# Patient Record
Sex: Female | Born: 1945 | Race: White | Hispanic: No | Marital: Married | Smoking: Never smoker
Health system: Southern US, Community
[De-identification: ages and names within clinical notes are randomized; demographics above are authoritative.]

## PROBLEM LIST (undated history)

## (undated) DIAGNOSIS — C801 Malignant (primary) neoplasm, unspecified: Secondary | ICD-10-CM

## (undated) HISTORY — PX: BREAST SURGERY: SHX581

## (undated) HISTORY — DX: Malignant (primary) neoplasm, unspecified: C80.1

## (undated) HISTORY — PX: APPENDECTOMY: SHX54

---

## 2007-07-19 ENCOUNTER — Encounter: Admission: RE | Admit: 2007-07-19 | Discharge: 2007-07-19 | Payer: Self-pay | Admitting: Obstetrics & Gynecology

## 2009-08-24 HISTORY — PX: BREAST LUMPECTOMY: SHX2

## 2009-09-15 ENCOUNTER — Encounter: Admission: RE | Admit: 2009-09-15 | Discharge: 2009-09-15 | Payer: Self-pay | Admitting: Obstetrics & Gynecology

## 2009-09-16 ENCOUNTER — Telehealth (INDEPENDENT_AMBULATORY_CARE_PROVIDER_SITE_OTHER): Payer: Self-pay | Admitting: *Deleted

## 2009-09-18 ENCOUNTER — Encounter: Payer: Self-pay | Admitting: Family Medicine

## 2009-09-23 ENCOUNTER — Encounter: Admission: RE | Admit: 2009-09-23 | Discharge: 2009-09-23 | Payer: Self-pay | Admitting: Obstetrics & Gynecology

## 2009-09-25 ENCOUNTER — Ambulatory Visit: Payer: Self-pay | Admitting: Genetic Counselor

## 2009-10-09 ENCOUNTER — Encounter: Admission: RE | Admit: 2009-10-09 | Discharge: 2009-10-09 | Payer: Self-pay | Admitting: Surgery

## 2009-10-09 ENCOUNTER — Ambulatory Visit (HOSPITAL_BASED_OUTPATIENT_CLINIC_OR_DEPARTMENT_OTHER): Admission: RE | Admit: 2009-10-09 | Discharge: 2009-10-09 | Payer: Self-pay | Admitting: Surgery

## 2009-10-23 ENCOUNTER — Ambulatory Visit: Payer: Self-pay | Admitting: Oncology

## 2010-05-17 ENCOUNTER — Encounter: Payer: Self-pay | Admitting: Obstetrics & Gynecology

## 2010-05-26 NOTE — Progress Notes (Signed)
  Phone Note Call from Patient Call back at Home Phone [phone]   Caller: Charlaine Dalton Call For: Dr.Tower Summary of Call: Pt's husband called to cancel pt's new pt. appt. on 5/26/11with you.  Pt. was seen today for a small lump in her breast and they think it's Cancer and they scheduled her an appt. on Thursday morning to discuss how they'll proceed.  Pt's husband said they'll call back to r/s her appt.. Initial call taken by: Beau Fanny,  Sep 16, 2009 4:18 PM  Follow-up for Phone Call        thanks for the update -- please give them my best wishes  Follow-up by: Judith Part MD,  Sep 16, 2009 5:05 PM

## 2010-05-26 NOTE — Letter (Signed)
Summary: [facility]   Imported By: Lanelle Bal 10/09/2009 13:24:52  _____________________________________________________________________  External Attachment:    Type:   Image     Comment:   External Document

## 2010-07-13 LAB — CBC
HCT: 41.2 % (ref 36.0–46.0)
Hemoglobin: 14.1 g/dL (ref 12.0–15.0)
MCHC: 34.3 g/dL (ref 30.0–36.0)
Platelets: ADEQUATE 10*3/uL (ref 150–400)

## 2010-07-13 LAB — COMPREHENSIVE METABOLIC PANEL
ALT: 27 U/L (ref 0–35)
AST: 27 U/L (ref 0–37)
Alkaline Phosphatase: 80 U/L (ref 39–117)
BUN: 10 mg/dL (ref 6–23)
Creatinine, Ser: 0.8 mg/dL (ref 0.4–1.2)
GFR calc non Af Amer: >60 mL/min (ref >60)
Glucose, Bld: 116 mg/dL — ABNORMAL HIGH (ref 70–99)
Sodium: 137 mEq/L (ref 135–145)
Total Bilirubin: 0.7 mg/dL (ref 0.3–1.2)

## 2010-07-13 LAB — DIFFERENTIAL
Basophils Relative: 0 % (ref 0–1)
Eosinophils Absolute: 0.3 10*3/uL (ref 0.0–0.7)
Lymphs Abs: 2.5 10*3/uL (ref 0.7–4.0)
Neutro Abs: 5.5 10*3/uL (ref 1.7–7.7)
Neutrophils Relative %: 62 % (ref 43–77)

## 2010-07-13 LAB — LACTATE DEHYDROGENASE: LDH: 194 U/L (ref 94–250)

## 2011-04-06 ENCOUNTER — Other Ambulatory Visit: Payer: Self-pay | Admitting: Obstetrics & Gynecology

## 2011-08-08 IMAGING — MG MM DIAGNOSTIC UNILATERAL R
2 series · 2 of 2 positions shown · non-contrast
Comparison: With priors

CLINICAL DATA: Status post ultrasound-guided core biopsy of the
right breast

DIGITAL DIAGNOSTIC RIGHT MAMMOGRAM

[R CC]
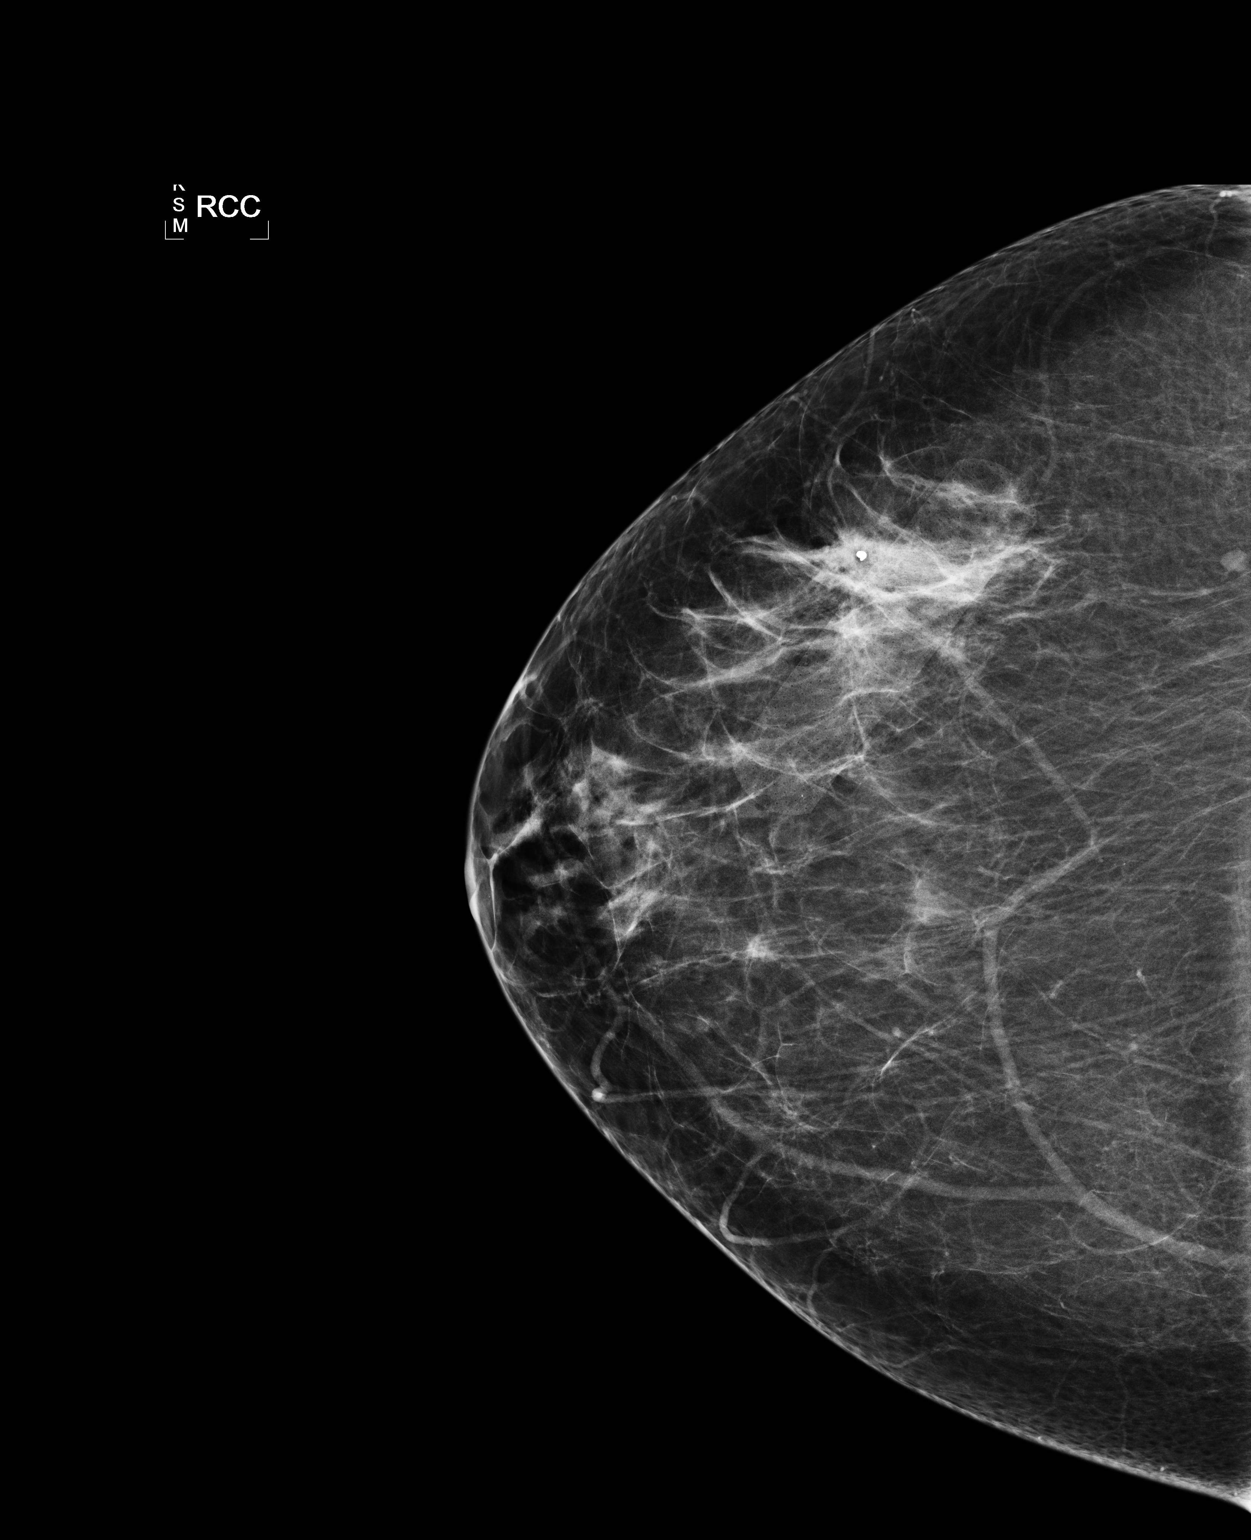

[R ML]
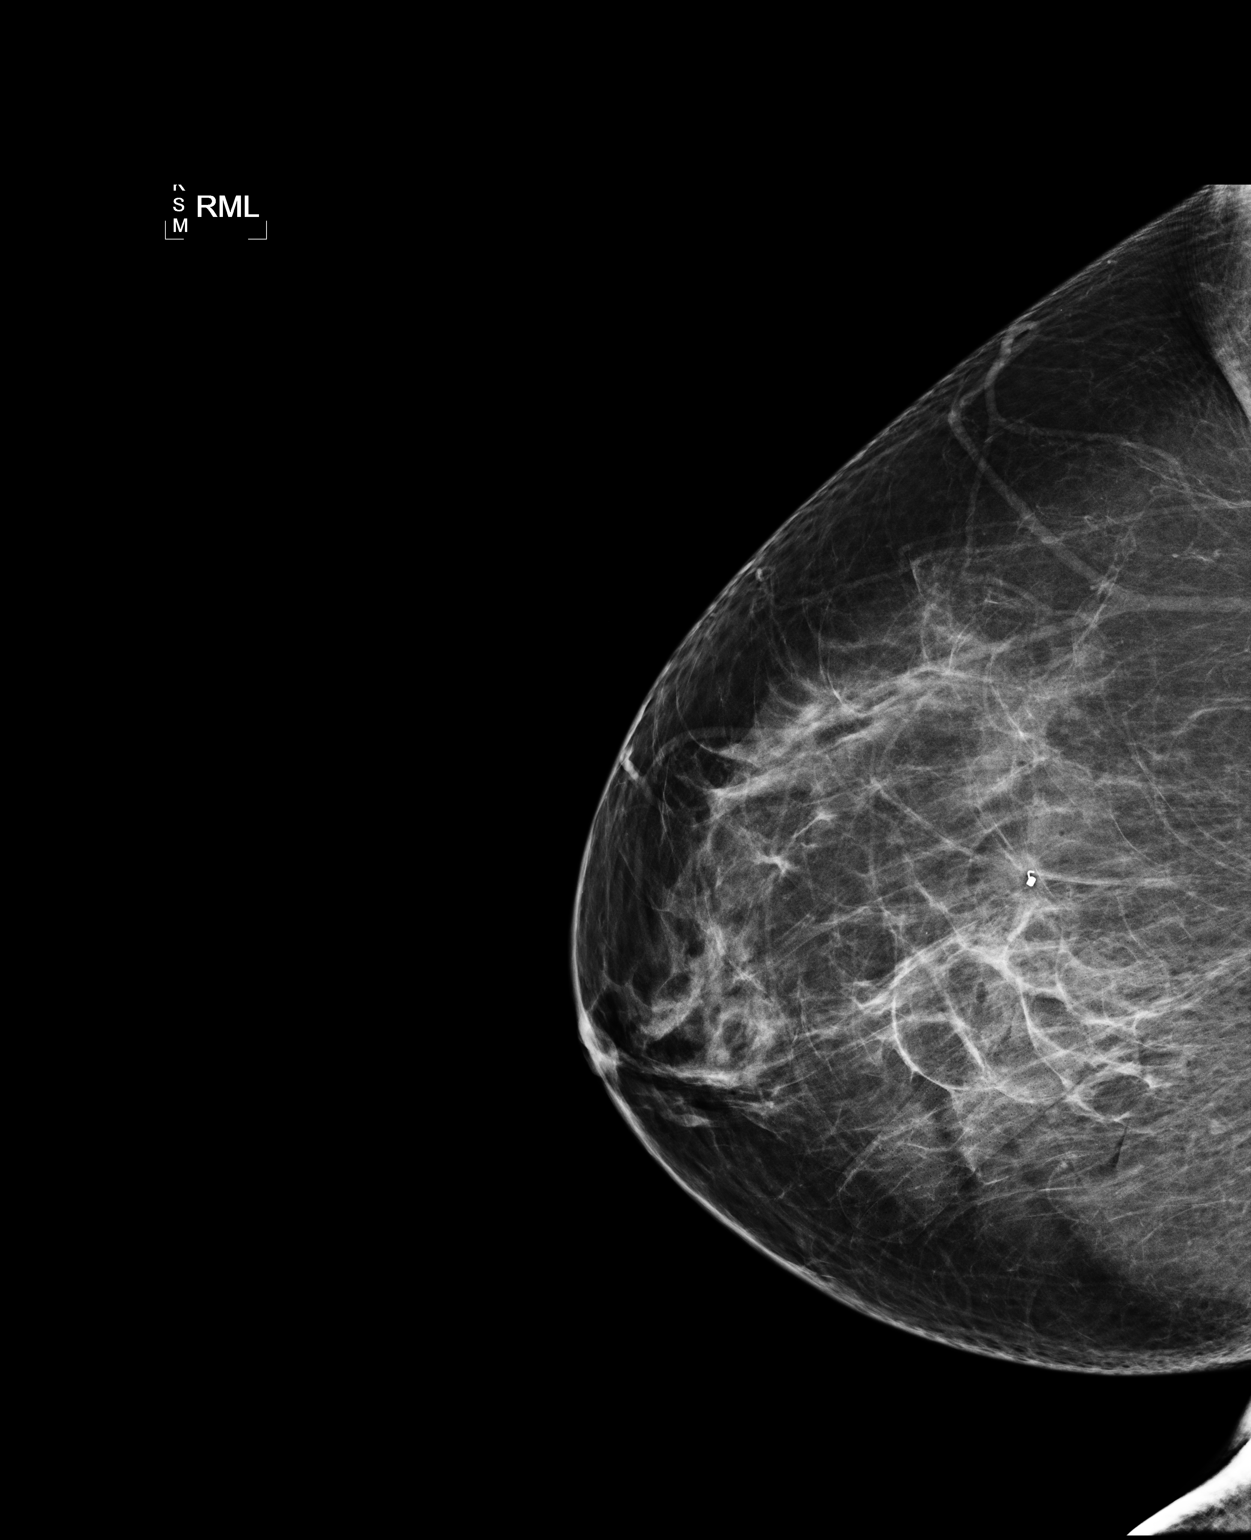

[2 of 2 positions shown; findings below may reference images not displayed]

FINDINGS: Films are performed following ultrasound guided biopsy
of a mass in the upper outer quadrant of the right breast.
Mammographic images demonstrate the clip is in the mass in the
upper outer quadrant of the right breast.
IMPRESSION: Status post ultrasound-guided core biopsy of the right breast with
pathology pending.

## 2011-10-22 ENCOUNTER — Other Ambulatory Visit: Payer: Self-pay | Admitting: Obstetrics & Gynecology

## 2011-10-22 DIAGNOSIS — Z853 Personal history of malignant neoplasm of breast: Secondary | ICD-10-CM

## 2011-10-22 DIAGNOSIS — Z9889 Other specified postprocedural states: Secondary | ICD-10-CM

## 2011-11-03 ENCOUNTER — Ambulatory Visit
Admission: RE | Admit: 2011-11-03 | Discharge: 2011-11-03 | Disposition: A | Discharge status: Home / Self Care | Payer: Self-pay | Source: Ambulatory Visit | Attending: Obstetrics & Gynecology | Admitting: Obstetrics & Gynecology

## 2011-11-03 DIAGNOSIS — Z853 Personal history of malignant neoplasm of breast: Secondary | ICD-10-CM

## 2011-11-03 DIAGNOSIS — Z9889 Other specified postprocedural states: Secondary | ICD-10-CM

## 2013-09-03 ENCOUNTER — Encounter: Payer: Self-pay | Admitting: Podiatry

## 2013-09-03 ENCOUNTER — Ambulatory Visit (INDEPENDENT_AMBULATORY_CARE_PROVIDER_SITE_OTHER): Payer: Medicare HMO

## 2013-09-03 ENCOUNTER — Ambulatory Visit (INDEPENDENT_AMBULATORY_CARE_PROVIDER_SITE_OTHER): Payer: Medicare HMO | Admitting: Podiatry

## 2013-09-03 VITALS — BP 129/69 | HR 54 | Resp 16 | Ht 62.0 in | Wt 173.0 lb

## 2013-09-03 DIAGNOSIS — M79609 Pain in unspecified limb: Secondary | ICD-10-CM

## 2013-09-03 DIAGNOSIS — M79673 Pain in unspecified foot: Secondary | ICD-10-CM

## 2013-09-03 DIAGNOSIS — M722 Plantar fascial fibromatosis: Secondary | ICD-10-CM

## 2013-09-03 MED ORDER — MELOXICAM 15 MG PO TABS: 15.0000 mg | ORAL_TABLET | Freq: Every day | ORAL | Status: DC

## 2013-09-03 MED ORDER — METHYLPREDNISOLONE (PAK) 4 MG PO TABS
ORAL_TABLET | ORAL | Status: DC
Start: 2013-09-03 — End: 2014-11-27

## 2013-09-03 NOTE — Progress Notes (Signed)
   Subjective:    Patient ID: Katie Osborne, female    DOB: 26-Dec-1945, 68 y.o.   MRN: [id number]  HPI Comments: My left foot in the arch and heel hurts. Its been going on for 3 weeks. The pain seems to be worse on different days. i use dr schoals pads in my shoes and i do stretch my foot.  Foot Pain      Review of Systems  HENT:       Ringing in ears  All other systems reviewed and are negative.      Objective:   Physical Exam: I have reviewed her past medical history medications allergies surgeries social history and review of systems. Pulses are strongly palpable bilateral foot. Capillary fill time to digits one through 5 bilateral is immediate. Neurologic sensorium is intact per Semmes-Weinstein monofilament 1 through 5 bilateral. Deep tendon reflexes are brisk and equal bilateral. Muscle strength is 5 over 5 dorsiflexors plantar flexors inverters everters all intrinsic musculature is intact. Orthopedic evaluation demonstrates pain on palpation medial continued tubercle of the left heel. Radiographic evaluation confirms plantar distally oriented calcaneal spur with soft tissue increase in density at the plantar fascial calcaneal insertion site indicative of plantar fasciitis left heel.        Assessment & Plan:  Assessment: Plantar fasciitis left.  Plan: Discussed etiology pathology conservative versus surgical therapies. At this point we injected the left heel today. She was dispensed a plantar fascial brace as well as a night splint. Discussed the use of prescription medication including Medrol Dosepak to be followed by Monday. We also discussed shoe gear therapy stretching exercises and shoe gear medications as well as ice therapy I will followup with her in one month

## 2013-10-03 ENCOUNTER — Encounter: Payer: Self-pay | Admitting: Podiatry

## 2013-10-03 ENCOUNTER — Ambulatory Visit (INDEPENDENT_AMBULATORY_CARE_PROVIDER_SITE_OTHER): Payer: Medicare HMO | Admitting: Podiatry

## 2013-10-03 VITALS — BP 120/80 | HR 64 | Resp 16

## 2013-10-03 DIAGNOSIS — M722 Plantar fascial fibromatosis: Secondary | ICD-10-CM

## 2013-10-03 NOTE — Progress Notes (Signed)
She presents today states that her plantar fasciitis to her left foot is proximally 75% better now. She only has a little tenderness right ear a she points to the insertion site of the posterior tibial tendon left foot.  Objective: Pulses are strongly palpable left foot. Mild tenderness on palpation of the insertion site of the posterior tibial tendon at the navicular tuberosity. She has no pain on palpation of the medial calcaneal tubercle at the plantar fascial insertion site. She has no pain on palpation of the calf negative Homans sign.  Assessment: Resolving plantar fasciitis and 75% left foot. Mild compensatory posterior tibial tendinitis left. Compensatory pain for her left leg as well.  Plan: Discussed etiology pathology conservative versus surgical therapies at this point I encouraged her to continue all conservative therapies and tissues 100% well plus one month. She will notify should she have a recurrence.

## 2013-12-25 ENCOUNTER — Other Ambulatory Visit: Payer: Self-pay | Admitting: Obstetrics & Gynecology

## 2014-01-01 ENCOUNTER — Other Ambulatory Visit: Payer: Self-pay | Admitting: Obstetrics & Gynecology

## 2014-01-01 DIAGNOSIS — Z853 Personal history of malignant neoplasm of breast: Secondary | ICD-10-CM

## 2014-01-09 ENCOUNTER — Encounter (INDEPENDENT_AMBULATORY_CARE_PROVIDER_SITE_OTHER): Payer: Self-pay

## 2014-01-09 ENCOUNTER — Ambulatory Visit
Admission: RE | Admit: 2014-01-09 | Discharge: 2014-01-09 | Disposition: A | Discharge status: Home / Self Care | Payer: Medicare HMO | Source: Ambulatory Visit | Attending: Obstetrics & Gynecology | Admitting: Obstetrics & Gynecology

## 2014-01-09 DIAGNOSIS — Z853 Personal history of malignant neoplasm of breast: Secondary | ICD-10-CM

## 2014-11-12 ENCOUNTER — Other Ambulatory Visit: Payer: Self-pay

## 2014-11-13 ENCOUNTER — Other Ambulatory Visit: Payer: Self-pay | Admitting: Obstetrics & Gynecology

## 2014-11-13 DIAGNOSIS — Z9889 Other specified postprocedural states: Secondary | ICD-10-CM

## 2014-11-13 DIAGNOSIS — C50911 Malignant neoplasm of unspecified site of right female breast: Secondary | ICD-10-CM

## 2014-11-27 ENCOUNTER — Ambulatory Visit: Payer: Self-pay

## 2014-11-27 ENCOUNTER — Ambulatory Visit (INDEPENDENT_AMBULATORY_CARE_PROVIDER_SITE_OTHER): Payer: Medicare HMO | Admitting: Podiatry

## 2014-11-27 ENCOUNTER — Encounter: Payer: Self-pay | Admitting: Podiatry

## 2014-11-27 VITALS — BP 146/74 | HR 73 | Resp 16

## 2014-11-27 DIAGNOSIS — Q828 Other specified congenital malformations of skin: Secondary | ICD-10-CM | POA: Diagnosis not present

## 2014-11-27 NOTE — Progress Notes (Signed)
She presents today for follow-up of a porokeratosis plantar aspect of the right foot. She states that it has finally come back and it hurts terribly.  Objective: Vital signs are stable she is alert and oriented 3 pulses are palpable bilateral. Porokeratosis or visible beneath the second metatarsophalangeal joints bilaterally. Right greater than left.  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  Plan: Debridement of porokeratosis today mechanically bilaterally I also did place a salicylic acid patch to be left on for 3 days without getting wet and then to be removed. I will follow-up with her in 6 weeks if necessary.

## 2014-12-23 ENCOUNTER — Ambulatory Visit
Admission: RE | Admit: 2014-12-23 | Discharge: 2014-12-23 | Disposition: A | Discharge status: Home / Self Care | Payer: Medicare HMO | Source: Ambulatory Visit | Attending: Obstetrics & Gynecology | Admitting: Obstetrics & Gynecology

## 2014-12-23 DIAGNOSIS — Z9889 Other specified postprocedural states: Secondary | ICD-10-CM

## 2014-12-23 DIAGNOSIS — C50911 Malignant neoplasm of unspecified site of right female breast: Secondary | ICD-10-CM

## 2015-01-08 ENCOUNTER — Ambulatory Visit: Payer: Medicare HMO | Admitting: Podiatry

## 2015-05-29 DIAGNOSIS — H60543 Acute eczematoid otitis externa, bilateral: Secondary | ICD-10-CM | POA: Diagnosis not present

## 2015-05-29 DIAGNOSIS — H903 Sensorineural hearing loss, bilateral: Secondary | ICD-10-CM | POA: Diagnosis not present

## 2015-05-29 DIAGNOSIS — H9319 Tinnitus, unspecified ear: Secondary | ICD-10-CM | POA: Diagnosis not present

## 2015-05-29 DIAGNOSIS — H6123 Impacted cerumen, bilateral: Secondary | ICD-10-CM | POA: Diagnosis not present

## 2015-07-21 ENCOUNTER — Telehealth: Payer: Self-pay | Admitting: Podiatry

## 2015-07-21 ENCOUNTER — Telehealth: Payer: Self-pay | Admitting: *Deleted

## 2015-07-21 NOTE — Telephone Encounter (Signed)
PT REQUEST SOME KIND OF MEDICINE FOR HER PAIN UNTIL HER APPT ON 07/28/15

## 2015-07-21 NOTE — Telephone Encounter (Signed)
Pt states she is having pain that began burning and tingling in the plantar fascia again and began to use the ice, ibuprofen and walking boot again.  Pt states the pain runs from the bottom of her foot up the back of her leg to her knee. Pt states she's been using heat to the calf.  Pt denies hardness, redness or edema to the calf, but states her calves are swollen most of the time anyway.  I told pt to continue her therapies and I would call again if more instructions before Thursday.

## 2015-07-24 ENCOUNTER — Encounter: Payer: Self-pay | Admitting: Podiatry

## 2015-07-24 ENCOUNTER — Ambulatory Visit (INDEPENDENT_AMBULATORY_CARE_PROVIDER_SITE_OTHER): Payer: Medicare HMO | Admitting: Podiatry

## 2015-07-24 VITALS — BP 154/86 | HR 60 | Resp 12

## 2015-07-24 DIAGNOSIS — Q828 Other specified congenital malformations of skin: Secondary | ICD-10-CM | POA: Diagnosis not present

## 2015-07-24 DIAGNOSIS — M722 Plantar fascial fibromatosis: Secondary | ICD-10-CM

## 2015-07-24 MED ORDER — MELOXICAM 15 MG PO TABS: 15.0000 mg | ORAL_TABLET | Freq: Every day | ORAL | Status: DC

## 2015-07-25 ENCOUNTER — Encounter: Payer: Self-pay | Admitting: Podiatry

## 2015-07-26 NOTE — Progress Notes (Signed)
She presents today with a chief complaint of a painful callus plantar aspect of the forefoot left. She is also complaining of left heel pain that has started to redevelop.  Objective: Vital signs are stable alert and oriented 3. Pulses are palpable. Neurologic sensorium is intact. She has pain on palpation medial calcaneal tubercle of the left heel. No calf pain. Reactive hyperkeratosis forefoot left. No edema no cellulitis drainage or odor.  Assessment: Porokeratosis callus forefoot left. Plantar fasciitis left.  Plan: We injected the left heel today she will start back on all conservative therapies. I debrided all reactive hyperkeratosis for her today follow up with her as needed.

## 2015-07-28 ENCOUNTER — Ambulatory Visit: Payer: Medicare HMO | Admitting: Podiatry

## 2015-11-20 ENCOUNTER — Other Ambulatory Visit: Payer: Self-pay | Admitting: Obstetrics & Gynecology

## 2015-11-20 DIAGNOSIS — Z853 Personal history of malignant neoplasm of breast: Secondary | ICD-10-CM

## 2015-12-17 DIAGNOSIS — Z124 Encounter for screening for malignant neoplasm of cervix: Secondary | ICD-10-CM | POA: Diagnosis not present

## 2015-12-17 DIAGNOSIS — Z01419 Encounter for gynecological examination (general) (routine) without abnormal findings: Secondary | ICD-10-CM | POA: Diagnosis not present

## 2015-12-17 DIAGNOSIS — Z6833 Body mass index (BMI) 33.0-33.9, adult: Secondary | ICD-10-CM | POA: Diagnosis not present

## 2015-12-24 ENCOUNTER — Ambulatory Visit
Admission: RE | Admit: 2015-12-24 | Discharge: 2015-12-24 | Disposition: A | Discharge status: Home / Self Care | Payer: Medicare HMO | Source: Ambulatory Visit | Attending: Obstetrics & Gynecology | Admitting: Obstetrics & Gynecology

## 2015-12-24 DIAGNOSIS — R928 Other abnormal and inconclusive findings on diagnostic imaging of breast: Secondary | ICD-10-CM | POA: Diagnosis not present

## 2015-12-24 DIAGNOSIS — Z853 Personal history of malignant neoplasm of breast: Secondary | ICD-10-CM

## 2016-11-15 DIAGNOSIS — R69 Illness, unspecified: Secondary | ICD-10-CM | POA: Diagnosis not present

## 2016-11-16 ENCOUNTER — Other Ambulatory Visit: Payer: Self-pay | Admitting: Obstetrics & Gynecology

## 2016-11-16 DIAGNOSIS — Z1231 Encounter for screening mammogram for malignant neoplasm of breast: Secondary | ICD-10-CM

## 2016-12-20 DIAGNOSIS — Z6832 Body mass index (BMI) 32.0-32.9, adult: Secondary | ICD-10-CM | POA: Diagnosis not present

## 2016-12-20 DIAGNOSIS — Z124 Encounter for screening for malignant neoplasm of cervix: Secondary | ICD-10-CM | POA: Diagnosis not present

## 2016-12-28 ENCOUNTER — Ambulatory Visit
Admission: RE | Admit: 2016-12-28 | Discharge: 2016-12-28 | Disposition: A | Discharge status: Home / Self Care | Payer: Medicare HMO | Source: Ambulatory Visit | Attending: Obstetrics & Gynecology | Admitting: Obstetrics & Gynecology

## 2016-12-28 DIAGNOSIS — Z1231 Encounter for screening mammogram for malignant neoplasm of breast: Secondary | ICD-10-CM | POA: Diagnosis not present

## 2017-03-29 DIAGNOSIS — N390 Urinary tract infection, site not specified: Secondary | ICD-10-CM | POA: Diagnosis not present

## 2017-03-29 DIAGNOSIS — B373 Candidiasis of vulva and vagina: Secondary | ICD-10-CM | POA: Diagnosis not present

## 2017-03-29 DIAGNOSIS — R3913 Splitting of urinary stream: Secondary | ICD-10-CM | POA: Diagnosis not present

## 2017-05-25 DIAGNOSIS — R69 Illness, unspecified: Secondary | ICD-10-CM | POA: Diagnosis not present

## 2017-11-23 DIAGNOSIS — R69 Illness, unspecified: Secondary | ICD-10-CM | POA: Diagnosis not present

## 2017-11-24 ENCOUNTER — Other Ambulatory Visit: Payer: Self-pay | Admitting: Obstetrics & Gynecology

## 2017-11-24 DIAGNOSIS — Z1231 Encounter for screening mammogram for malignant neoplasm of breast: Secondary | ICD-10-CM

## 2017-12-27 DIAGNOSIS — Z6832 Body mass index (BMI) 32.0-32.9, adult: Secondary | ICD-10-CM | POA: Diagnosis not present

## 2017-12-27 DIAGNOSIS — Z01419 Encounter for gynecological examination (general) (routine) without abnormal findings: Secondary | ICD-10-CM | POA: Diagnosis not present

## 2017-12-30 ENCOUNTER — Ambulatory Visit
Admission: RE | Admit: 2017-12-30 | Discharge: 2017-12-30 | Disposition: A | Discharge status: Home / Self Care | Payer: Medicare HMO | Source: Ambulatory Visit | Attending: Obstetrics & Gynecology | Admitting: Obstetrics & Gynecology

## 2017-12-30 DIAGNOSIS — Z1231 Encounter for screening mammogram for malignant neoplasm of breast: Secondary | ICD-10-CM | POA: Diagnosis not present

## 2018-05-30 DIAGNOSIS — R69 Illness, unspecified: Secondary | ICD-10-CM | POA: Diagnosis not present

## 2018-06-06 DIAGNOSIS — E7800 Pure hypercholesterolemia, unspecified: Secondary | ICD-10-CM | POA: Insufficient documentation

## 2018-06-06 DIAGNOSIS — R918 Other nonspecific abnormal finding of lung field: Secondary | ICD-10-CM | POA: Diagnosis not present

## 2018-06-06 DIAGNOSIS — Z1211 Encounter for screening for malignant neoplasm of colon: Secondary | ICD-10-CM | POA: Diagnosis not present

## 2018-06-06 DIAGNOSIS — M25561 Pain in right knee: Secondary | ICD-10-CM | POA: Diagnosis not present

## 2018-06-06 DIAGNOSIS — M1711 Unilateral primary osteoarthritis, right knee: Secondary | ICD-10-CM | POA: Diagnosis not present

## 2018-06-06 DIAGNOSIS — Z79899 Other long term (current) drug therapy: Secondary | ICD-10-CM | POA: Diagnosis not present

## 2018-06-06 DIAGNOSIS — N8110 Cystocele, unspecified: Secondary | ICD-10-CM | POA: Diagnosis not present

## 2018-06-06 DIAGNOSIS — Z853 Personal history of malignant neoplasm of breast: Secondary | ICD-10-CM | POA: Diagnosis not present

## 2018-06-13 DIAGNOSIS — Z1211 Encounter for screening for malignant neoplasm of colon: Secondary | ICD-10-CM | POA: Diagnosis not present

## 2018-06-26 ENCOUNTER — Ambulatory Visit: Payer: Medicare HMO

## 2018-06-26 ENCOUNTER — Encounter: Payer: Self-pay | Admitting: Podiatry

## 2018-06-26 ENCOUNTER — Ambulatory Visit: Payer: Medicare HMO | Admitting: Podiatry

## 2018-06-26 DIAGNOSIS — Q828 Other specified congenital malformations of skin: Secondary | ICD-10-CM

## 2018-06-26 DIAGNOSIS — D2371 Other benign neoplasm of skin of right lower limb, including hip: Secondary | ICD-10-CM | POA: Diagnosis not present

## 2018-06-26 DIAGNOSIS — M79671 Pain in right foot: Secondary | ICD-10-CM

## 2018-06-26 DIAGNOSIS — D2370 Other benign neoplasm of skin of unspecified lower limb, including hip: Secondary | ICD-10-CM | POA: Diagnosis not present

## 2018-06-26 NOTE — Progress Notes (Signed)
  Subjective:  Patient ID: Katie Osborne, female    DOB: Feb 01, 1946,  MRN: [id number] HPI Chief Complaint  Patient presents with  . Callouses    Patient presents today for painful porokeratosis bottom of left foot again.  She states "they are not as bad as before but they are starting to hurt me again and the one on my 3rd toe is very sore and hurts me at night"    73 y.o. female presents with the above complaint.   ROS: Denies fever chills nausea vomiting muscle aches pains calf pain back pain chest pain shortness of breath.  Past Medical History:  Diagnosis Date  . Cancer [facility])    Past Surgical History:  Procedure Laterality Date  . APPENDECTOMY    . BREAST LUMPECTOMY Right 08/2009  . BREAST SURGERY      Current Outpatient Medications:  .  atorvastatin (LIPITOR) 20 MG tablet, , Disp: , Rfl:  .  calcium elemental as carbonate (BARIATRIC TUMS ULTRA) 400 MG chewable tablet, Chew by mouth., Disp: , Rfl:  .  Coenzyme Q10 (CO Q-10) 400 MG CAPS, Take by mouth., Disp: , Rfl:  .  Magnesium 200 MG TABS, Take by mouth., Disp: , Rfl:  .  thiamine 100 MG tablet, Take by mouth., Disp: , Rfl:  .  Zinc Acetate 50 MG CAPS, Take by mouth., Disp: , Rfl:   No Known Allergies Review of Systems Objective:  There were no vitals filed for this visit.  General: Well developed, nourished, in no acute distress, alert and oriented x3   Dermatological: Skin is warm, dry and supple bilateral. Nails x 10 are well maintained; remaining integument appears unremarkable at this time. There are no open sores, no preulcerative lesions, no rash or signs of infection present.  Porokeratotic lesion subsecond metatarsal phalangeal joint x3 left  Vascular: Dorsalis Pedis artery and Posterior Tibial artery pedal pulses are 2/4 bilateral with immedate capillary fill time. Pedal hair growth present. No varicosities and no lower extremity edema present bilateral.   Neruologic: Grossly intact via light touch  bilateral. Vibratory intact via tuning fork bilateral. Protective threshold with Semmes Wienstein monofilament intact to all pedal sites bilateral. Patellar and Achilles deep tendon reflexes 2+ bilateral. No Babinski or clonus noted bilateral.   Musculoskeletal: No gross boney pedal deformities bilateral. No pain, crepitus, or limitation noted with foot and ankle range of motion bilateral. Muscular strength 5/5 in all groups tested bilateral.  Gait: Unassisted, Nonantalgic.    Radiographs:  None taken  Assessment & Plan:   Assessment: Porokeratosis plantar aspect left foot no open lesions or wounds  Plan: Debrided porokeratotic lesion manually and then placed Cantharone under occlusion to be washed off thoroughly tomorrow.  Follow-up with her on an as-needed basis.      T. [place], [place]

## 2018-06-28 ENCOUNTER — Telehealth: Payer: Self-pay | Admitting: *Deleted

## 2018-06-28 NOTE — Telephone Encounter (Signed)
Called patient and left message to call office back.

## 2018-06-28 NOTE — Telephone Encounter (Signed)
You could have her in to see Dr. Prudence Davidson tomorrow if necessary.

## 2018-06-28 NOTE — Telephone Encounter (Signed)
Pt states saw DR. Hyatt Monday and had several areas removed, today they are red and painful and she wanted to know if she needed to be seen.

## 2018-12-06 ENCOUNTER — Other Ambulatory Visit: Payer: Self-pay | Admitting: Obstetrics & Gynecology

## 2018-12-06 DIAGNOSIS — Z1231 Encounter for screening mammogram for malignant neoplasm of breast: Secondary | ICD-10-CM

## 2018-12-29 DIAGNOSIS — H2513 Age-related nuclear cataract, bilateral: Secondary | ICD-10-CM | POA: Diagnosis not present

## 2019-01-02 DIAGNOSIS — Z124 Encounter for screening for malignant neoplasm of cervix: Secondary | ICD-10-CM | POA: Diagnosis not present

## 2019-01-02 DIAGNOSIS — Z6834 Body mass index (BMI) 34.0-34.9, adult: Secondary | ICD-10-CM | POA: Diagnosis not present

## 2019-01-03 DIAGNOSIS — Z124 Encounter for screening for malignant neoplasm of cervix: Secondary | ICD-10-CM | POA: Diagnosis not present

## 2019-01-23 ENCOUNTER — Other Ambulatory Visit: Payer: Self-pay

## 2019-01-23 ENCOUNTER — Ambulatory Visit
Admission: RE | Admit: 2019-01-23 | Discharge: 2019-01-23 | Disposition: A | Discharge status: Home / Self Care | Payer: Medicare HMO | Source: Ambulatory Visit | Attending: Obstetrics & Gynecology | Admitting: Obstetrics & Gynecology

## 2019-01-23 DIAGNOSIS — Z1231 Encounter for screening mammogram for malignant neoplasm of breast: Secondary | ICD-10-CM

## 2019-01-31 ENCOUNTER — Encounter: Payer: Self-pay | Admitting: Podiatry

## 2019-01-31 ENCOUNTER — Other Ambulatory Visit: Payer: Self-pay

## 2019-01-31 ENCOUNTER — Ambulatory Visit: Payer: Medicare HMO | Admitting: Podiatry

## 2019-01-31 DIAGNOSIS — Q828 Other specified congenital malformations of skin: Secondary | ICD-10-CM | POA: Diagnosis not present

## 2019-01-31 DIAGNOSIS — M722 Plantar fascial fibromatosis: Secondary | ICD-10-CM

## 2019-01-31 DIAGNOSIS — M76821 Posterior tibial tendinitis, right leg: Secondary | ICD-10-CM | POA: Diagnosis not present

## 2019-01-31 NOTE — Progress Notes (Signed)
She presents today chief complaint of pain beneath the navicular tuberosity of the left foot states that I think my plan fasciitis is starting to I will act up again.  She is also complaining of a painful lesion sub-fourth metatarsal head of the left foot.  States that the other one has gone away with the acid that we applied last visit.  States that she does not want asked that this visit.  Objective: Vital signs are stable alert and oriented x3.  Pulses are palpable left foot.  She has mild tenderness on palpation medial calcaneal tubercle into the posterior tibial tendon at its insertion to the plantar aspect of the navicular tuberosity left.  She also has reactive porokeratotic lesion beneath the fifth fourth metatarsal head of the left foot.  Assessment: Porokeratosis left foot.  Plan fasciitis sub-navicular left foot.  Plan: After sterile Betadine skin prep I injected to the point of maximal tenderness along the navicular 20 mg Kenalog 5 mg of Marcaine.  Tolerated procedure well.  Also debrided the reactive hyperkeratotic lesion follow-up with [place] as needed.

## 2019-02-05 ENCOUNTER — Ambulatory Visit: Payer: Medicare HMO | Admitting: Podiatry

## 2019-04-17 DIAGNOSIS — Z0100 Encounter for examination of eyes and vision without abnormal findings: Secondary | ICD-10-CM | POA: Diagnosis not present

## 2019-05-17 DIAGNOSIS — R69 Illness, unspecified: Secondary | ICD-10-CM | POA: Diagnosis not present

## 2019-05-21 DIAGNOSIS — R69 Illness, unspecified: Secondary | ICD-10-CM | POA: Diagnosis not present

## 2019-05-29 DIAGNOSIS — R69 Illness, unspecified: Secondary | ICD-10-CM | POA: Diagnosis not present

## 2019-09-28 DIAGNOSIS — Z79899 Other long term (current) drug therapy: Secondary | ICD-10-CM | POA: Diagnosis not present

## 2019-09-28 DIAGNOSIS — Z1211 Encounter for screening for malignant neoplasm of colon: Secondary | ICD-10-CM | POA: Diagnosis not present

## 2019-09-28 DIAGNOSIS — E7800 Pure hypercholesterolemia, unspecified: Secondary | ICD-10-CM | POA: Diagnosis not present

## 2019-09-28 DIAGNOSIS — Z0000 Encounter for general adult medical examination without abnormal findings: Secondary | ICD-10-CM | POA: Diagnosis not present

## 2019-09-28 DIAGNOSIS — Z853 Personal history of malignant neoplasm of breast: Secondary | ICD-10-CM | POA: Diagnosis not present

## 2019-10-02 DIAGNOSIS — E7800 Pure hypercholesterolemia, unspecified: Secondary | ICD-10-CM | POA: Diagnosis not present

## 2019-10-02 DIAGNOSIS — Z79899 Other long term (current) drug therapy: Secondary | ICD-10-CM | POA: Diagnosis not present

## 2019-10-10 DIAGNOSIS — Z1211 Encounter for screening for malignant neoplasm of colon: Secondary | ICD-10-CM | POA: Diagnosis not present

## 2019-12-14 ENCOUNTER — Other Ambulatory Visit: Payer: Self-pay | Admitting: Obstetrics & Gynecology

## 2019-12-14 DIAGNOSIS — Z1231 Encounter for screening mammogram for malignant neoplasm of breast: Secondary | ICD-10-CM

## 2020-01-07 DIAGNOSIS — Z124 Encounter for screening for malignant neoplasm of cervix: Secondary | ICD-10-CM | POA: Diagnosis not present

## 2020-01-07 DIAGNOSIS — Z6834 Body mass index (BMI) 34.0-34.9, adult: Secondary | ICD-10-CM | POA: Diagnosis not present

## 2020-01-24 ENCOUNTER — Other Ambulatory Visit: Payer: Self-pay

## 2020-01-24 ENCOUNTER — Ambulatory Visit
Admission: RE | Admit: 2020-01-24 | Discharge: 2020-01-24 | Disposition: A | Discharge status: Home / Self Care | Payer: Medicare HMO | Source: Ambulatory Visit | Attending: Obstetrics & Gynecology | Admitting: Obstetrics & Gynecology

## 2020-01-24 DIAGNOSIS — Z1231 Encounter for screening mammogram for malignant neoplasm of breast: Secondary | ICD-10-CM

## 2020-02-13 DIAGNOSIS — N819 Female genital prolapse, unspecified: Secondary | ICD-10-CM | POA: Diagnosis not present

## 2020-02-14 DIAGNOSIS — R69 Illness, unspecified: Secondary | ICD-10-CM | POA: Diagnosis not present

## 2020-05-02 DIAGNOSIS — N813 Complete uterovaginal prolapse: Secondary | ICD-10-CM | POA: Diagnosis not present

## 2020-07-02 DIAGNOSIS — N814 Uterovaginal prolapse, unspecified: Secondary | ICD-10-CM | POA: Diagnosis not present

## 2020-07-02 DIAGNOSIS — Z01818 Encounter for other preprocedural examination: Secondary | ICD-10-CM | POA: Diagnosis not present

## 2020-07-14 DIAGNOSIS — Z79899 Other long term (current) drug therapy: Secondary | ICD-10-CM | POA: Diagnosis not present

## 2020-07-14 DIAGNOSIS — N888 Other specified noninflammatory disorders of cervix uteri: Secondary | ICD-10-CM | POA: Diagnosis not present

## 2020-07-14 DIAGNOSIS — N800 Endometriosis of uterus: Secondary | ICD-10-CM | POA: Diagnosis not present

## 2020-07-14 DIAGNOSIS — N838 Other noninflammatory disorders of ovary, fallopian tube and broad ligament: Secondary | ICD-10-CM | POA: Diagnosis not present

## 2020-07-14 DIAGNOSIS — N813 Complete uterovaginal prolapse: Secondary | ICD-10-CM | POA: Diagnosis not present

## 2020-07-15 DIAGNOSIS — Z79899 Other long term (current) drug therapy: Secondary | ICD-10-CM | POA: Diagnosis not present

## 2020-07-15 DIAGNOSIS — N813 Complete uterovaginal prolapse: Secondary | ICD-10-CM | POA: Diagnosis not present

## 2020-11-25 DIAGNOSIS — I10 Essential (primary) hypertension: Secondary | ICD-10-CM | POA: Diagnosis not present

## 2020-11-25 DIAGNOSIS — Z853 Personal history of malignant neoplasm of breast: Secondary | ICD-10-CM | POA: Diagnosis not present

## 2020-11-25 DIAGNOSIS — Z1231 Encounter for screening mammogram for malignant neoplasm of breast: Secondary | ICD-10-CM | POA: Diagnosis not present

## 2020-11-25 DIAGNOSIS — Z1211 Encounter for screening for malignant neoplasm of colon: Secondary | ICD-10-CM | POA: Diagnosis not present

## 2020-11-25 DIAGNOSIS — Z0000 Encounter for general adult medical examination without abnormal findings: Secondary | ICD-10-CM | POA: Diagnosis not present

## 2020-11-25 DIAGNOSIS — Z79899 Other long term (current) drug therapy: Secondary | ICD-10-CM | POA: Diagnosis not present

## 2020-11-25 DIAGNOSIS — E785 Hyperlipidemia, unspecified: Secondary | ICD-10-CM | POA: Diagnosis not present

## 2020-11-25 DIAGNOSIS — I8310 Varicose veins of unspecified lower extremity with inflammation: Secondary | ICD-10-CM | POA: Diagnosis not present

## 2020-12-01 ENCOUNTER — Other Ambulatory Visit: Payer: Self-pay | Admitting: Internal Medicine

## 2020-12-01 DIAGNOSIS — Z1231 Encounter for screening mammogram for malignant neoplasm of breast: Secondary | ICD-10-CM

## 2021-01-05 ENCOUNTER — Other Ambulatory Visit (INDEPENDENT_AMBULATORY_CARE_PROVIDER_SITE_OTHER): Payer: Self-pay | Admitting: Nurse Practitioner

## 2021-01-05 DIAGNOSIS — I8311 Varicose veins of right lower extremity with inflammation: Secondary | ICD-10-CM

## 2021-01-06 ENCOUNTER — Other Ambulatory Visit: Payer: Self-pay

## 2021-01-06 ENCOUNTER — Encounter (INDEPENDENT_AMBULATORY_CARE_PROVIDER_SITE_OTHER): Payer: Medicare HMO | Admitting: Nurse Practitioner

## 2021-01-06 ENCOUNTER — Ambulatory Visit (INDEPENDENT_AMBULATORY_CARE_PROVIDER_SITE_OTHER): Payer: Medicare HMO

## 2021-01-06 DIAGNOSIS — I8311 Varicose veins of right lower extremity with inflammation: Secondary | ICD-10-CM | POA: Diagnosis not present

## 2021-01-09 DIAGNOSIS — N952 Postmenopausal atrophic vaginitis: Secondary | ICD-10-CM | POA: Diagnosis not present

## 2021-01-09 DIAGNOSIS — Z01419 Encounter for gynecological examination (general) (routine) without abnormal findings: Secondary | ICD-10-CM | POA: Diagnosis not present

## 2021-01-09 DIAGNOSIS — Z6833 Body mass index (BMI) 33.0-33.9, adult: Secondary | ICD-10-CM | POA: Diagnosis not present

## 2021-01-13 ENCOUNTER — Encounter (INDEPENDENT_AMBULATORY_CARE_PROVIDER_SITE_OTHER): Payer: Medicare HMO | Admitting: Vascular Surgery

## 2021-01-20 ENCOUNTER — Encounter (INDEPENDENT_AMBULATORY_CARE_PROVIDER_SITE_OTHER): Payer: Self-pay | Admitting: Vascular Surgery

## 2021-01-20 ENCOUNTER — Other Ambulatory Visit: Payer: Self-pay

## 2021-01-20 ENCOUNTER — Ambulatory Visit (INDEPENDENT_AMBULATORY_CARE_PROVIDER_SITE_OTHER): Payer: Medicare HMO | Admitting: Vascular Surgery

## 2021-01-20 VITALS — BP 175/82 | HR 65 | Resp 16 | Ht 61.25 in | Wt 180.6 lb

## 2021-01-20 DIAGNOSIS — E7800 Pure hypercholesterolemia, unspecified: Secondary | ICD-10-CM | POA: Diagnosis not present

## 2021-01-20 DIAGNOSIS — I83811 Varicose veins of right lower extremities with pain: Secondary | ICD-10-CM

## 2021-01-20 NOTE — Assessment & Plan Note (Signed)
A couple of weeks ago, the patient underwent a venous study which showed no evidence of reflux in the right great or small saphenous vein.  There is no evidence of DVT or superficial thrombophlebitis.  There was a large cluster of varicosities in the area most noticeable to her behind the right knee.  This did not appear to connect to the great or small saphenous veins.  Had a long discussion with the patient today regarding the pathophysiology and natural history of venous disease.  I discussed that given this finding, depending on how bothersome the area is to her, we could perform foam sclerotherapy to remove the prominent painful varicosities.  She is not interested in having this done at current as her symptoms have improved with conservative measures and they are not disabling to her.  This is obviously reasonable.  She will contact her office if she has worsening symptoms and desires to have any intervention.  Otherwise, I will see her back as needed

## 2021-01-20 NOTE — Assessment & Plan Note (Signed)
lipid control important in reducing the progression of atherosclerotic disease. Continue statin therapy  

## 2021-01-20 NOTE — Progress Notes (Signed)
Patient ID: Katie Osborne, female   DOB: October 06, 1945, 75 y.o.   MRN: [id number]  Chief Complaint  Patient presents with   New Patient (Initial Visit)    Ref varicose veins with inflammation     HPI Katie Osborne is a 75 y.o. female.  I am asked to see the patient by Dr. Doy Hutching for evaluation of recent worsening prominent varicosities in the right lower leg.  The patient presents with complaints of symptomatic varicosities of the right posterior calf and knee area. The patient reports a long standing history of varicosities and they have become painful over time. There was no clear inciting event or causative factor that started the symptoms.  The right leg is more severly affected. The patient elevates the legs for relief. The pain is described as stinging and burning but this is not a daily problem and is actually a little better recently.  This started after a local injury to the area where she did not have a full-blown fall but did have some trauma to the right knee. The symptoms are generally most severe in the evening, particularly when they have been on their feet for long periods of time.  Elevation and support stockings have been used to try to improve the symptoms with some success. The patient complains of infrequent swelling as an associated symptom. The patient has no previous history of deep venous thrombosis or superficial thrombophlebitis to their knowledge.     Past Medical History:  Diagnosis Date   Cancer [facility])     Past Surgical History:  Procedure Laterality Date   APPENDECTOMY     BREAST LUMPECTOMY Right 08/2009   BREAST SURGERY      Family History  Problem Relation Age of Onset   Breast cancer Mother   No bleeding disorder, clotting disorders, or aneurysms   Social History   Tobacco Use   Smoking status: Never   Smokeless tobacco: Never  Substance Use Topics   Alcohol use: Not Currently   Drug use: Never     Allergies  Allergen Reactions    Metronidazole Rash    Current Outpatient Medications  Medication Sig Dispense Refill   atorvastatin (LIPITOR) 20 MG tablet      calcium elemental as carbonate (BARIATRIC TUMS ULTRA) 400 MG chewable tablet Chew by mouth.     Coenzyme Q10 (CO Q-10) 400 MG CAPS Take by mouth.     Magnesium 200 MG TABS Take by mouth.     thiamine 100 MG tablet Take by mouth.     Zinc Acetate 50 MG CAPS Take by mouth.     No current facility-administered medications for this visit.      REVIEW OF SYSTEMS (Negative unless checked)  Constitutional: [] Weight loss  [] Fever  [] Chills Cardiac: [] Chest pain   [] Chest pressure   [] Palpitations   [] Shortness of breath when laying flat   [] Shortness of breath at rest   [] Shortness of breath with exertion. Vascular:  [] Pain in legs with walking   [] Pain in legs at rest   [] Pain in legs when laying flat   [] Claudication   [] Pain in feet when walking  [] Pain in feet at rest  [] Pain in feet when laying flat   [] History of DVT   [] Phlebitis   [x] Swelling in legs   [x] Varicose veins   [] Non-healing ulcers Pulmonary:   [] Uses home oxygen   [] Productive cough   [] Hemoptysis   [] Wheeze  [] COPD   [] Asthma Neurologic:  [] Dizziness  []   Blackouts   [] Seizures   [] History of stroke   [] History of TIA  [] Aphasia   [] Temporary blindness   [] Dysphagia   [] Weakness or numbness in arms   [] Weakness or numbness in legs Musculoskeletal:  [x] Arthritis   [] Joint swelling   [x] Joint pain   [] Low back pain Hematologic:  [] Easy bruising  [] Easy bleeding   [] Hypercoagulable state   [] Anemic  [] Hepatitis Gastrointestinal:  [] Blood in stool   [] Vomiting blood  [] Gastroesophageal reflux/heartburn   [] Abdominal pain Genitourinary:  [] Chronic kidney disease   [] Difficult urination  [] Frequent urination  [] Burning with urination   [] Hematuria Skin:  [] Rashes   [] Ulcers   [] Wounds Psychological:  [] History of anxiety   []  History of major depression.    Physical Exam BP (!) 175/82 (BP Location:  Right Arm)   Pulse 65   Resp 16   Ht 5' 1.25" (1.556 m)   Wt 180 lb 9.6 oz (81.9 kg)   BMI 33.85 kg/m  Gen:  WD/WN, NAD Head: [place]/AT, No temporalis wasting.  Ear/Nose/Throat: Hearing grossly intact, dentition is good Eyes: Sclera non-icteric. Conjunctiva clear Neck: Supple. Trachea midline Pulmonary:  Good air movement, no use of accessory muscles, respirations not labored.  Cardiac: RRR, No JVD Vascular: Varicosities fairly extensive particularly in the posterior right calf and lower leg area and measuring up to 3 mm in the right lower extremity        Varicosities diffuse and measuring up to 1-2 mm in the left lower extremity Vessel Right Left  Radial Palpable Palpable                          PT Palpable Palpable  DP Palpable Palpable   Gastrointestinal: soft, non-tender/non-distended.  Musculoskeletal: M/S 5/5 throughout.   Trace RLE edema.  No LLE edema Neurologic: Sensation grossly intact in extremities.  Symmetrical.  Speech is fluent.  Psychiatric: Judgment intact, Mood & affect appropriate for pt's clinical situation. Dermatologic: No rashes or ulcers noted.  No cellulitis or open wounds.    Radiology VAS [place] LOWER EXTREMITY VENOUS REFLUX  Result Date: 01/09/2021  Lower Venous Reflux Study Patient Name:  Katie Osborne  Date of Exam:   01/06/2021 Medical Rec #: [id number]            Accession #:    [id number] Date of Birth: 16-Oct-1945            Patient Gender: F Patient Age:   88 years Exam Location:  [facility] Procedure:      VAS [place] LOWER EXTREMITY VENOUS REFLUX Referring Phys: Eulogio Ditch --------------------------------------------------------------------------------  Indications: Varicosities, and right legs.  Performing Technologist: Concha Norway RVT  Examination Guidelines: A complete evaluation includes B-mode imaging, spectral Doppler, color Doppler, and power Doppler as needed of all accessible portions of each vessel. Bilateral testing is  considered an integral part of a complete examination. Limited examinations for reoccurring indications may be performed as noted. The reflux portion of the exam is performed with the patient in reverse Trendelenburg. Significant venous reflux is defined as >500 ms in the superficial venous system, and >1 second in the deep venous system.   Summary: Right: - No evidence of deep vein thrombosis seen in the right lower extremity, from the common femoral through the popliteal veins. - No evidence of superficial venous thrombosis in the right lower extremity. - There is no evidence of venous reflux seen in the right lower extremity. - No evidence  of superficial venous reflux seen in the right greater saphenous vein. - No evidence of superficial venous reflux seen in the right short saphenous vein. - Grape cluster of varicose veins behind knee laterally with no apparent connection to GSV.  *See table(s) above for measurements and observations. Electronically signed by Leotis Pain MD on 01/09/2021 at 1:00:58 PM.    Final     Labs No results found for this or any previous visit (from the past 2160 hour(s)).  Assessment/Plan:  Varicose veins of leg with pain, right A couple of weeks ago, the patient underwent a venous study which showed no evidence of reflux in the right great or small saphenous vein.  There is no evidence of DVT or superficial thrombophlebitis.  There was a large cluster of varicosities in the area most noticeable to her behind the right knee.  This did not appear to connect to the great or small saphenous veins.  Had a long discussion with the patient today regarding the pathophysiology and natural history of venous disease.  I discussed that given this finding, depending on how bothersome the area is to her, we could perform foam sclerotherapy to remove the prominent painful varicosities.  She is not interested in having this done at current as her symptoms have improved with conservative measures  and they are not disabling to her.  This is obviously reasonable.  She will contact her office if she has worsening symptoms and desires to have any intervention.  Otherwise, I will see her back as needed  Pure hypercholesterolemia lipid control important in reducing the progression of atherosclerotic disease. Continue statin therapy      Leotis Pain 01/20/2021, 2:38 PM   This note was created with Dragon medical transcription system.  Any errors from dictation are unintentional.

## 2021-01-20 NOTE — Patient Instructions (Signed)
Varicose Veins Varicose veins are veins that have become enlarged, bulged, and twisted. They most often appear in the legs. What are the causes? This condition is caused by damage to the valves in the vein. These valves help blood return to your heart. When they are damaged and they stop working properly, blood may flow backward and back up in the veins near the skin, causing the veins to get larger and appear twisted. The condition can result from any issue that causes blood to back up, like pregnancy, prolonged standing, or obesity. What increases the risk? This condition is more likely to develop in people who are: On their feet a lot. Pregnant. Overweight. What are the signs or symptoms? Symptoms of this condition include: Bulging, twisted, and bluish veins. A feeling of heaviness. This may be worse at the end of the day. Leg pain. This may be worse at the end of the day. Swelling in the leg. Changes in skin color over the veins. How is this diagnosed? This condition may be diagnosed based on your symptoms, a physical exam, and an ultrasound test. How is this treated? Treatment for this condition may involve: Avoiding sitting or standing in one position for long periods of time. Wearing compression stockings. These stockings help to prevent blood clots and reduce swelling in the legs. Raising (elevating) the legs when resting. Losing weight. Exercising regularly. If you have persistent symptoms or want to improve the way your varicose veins look, you may choose to have a procedure to close the varicose veins off or to remove them. Treatments to close off the veins include: Sclerotherapy. In this treatment, a solution is injected into a vein to close it off. Laser treatment. In this treatment, the vein is heated with a laser to close it off. Radiofrequency vein ablation. In this treatment, an electrical current produced by radio waves is used to close off the vein. Treatments to  remove the veins include: Phlebectomy. In this treatment, the veins are removed through small incisions made over the veins. Vein ligation and stripping. In this treatment, incisions are made over the veins. The veins are then removed after being tied (ligated) with stitches (sutures). Follow these instructions at home: Activity Walk as much as possible. Walking increases blood flow. This helps blood return to the heart and takes pressure off your veins. It also increases your cardiovascular strength. Follow your health care provider's instructions about exercising. Do not stand or sit in one position for a long period of time. Do not sit with your legs crossed. Rest with your legs raised during the day. General instructions  Follow any diet instructions given to you by your health care provider. Wear compression stockings as directed by your health care provider. Do not wear other kinds of tight clothing around your legs, pelvis, or waist. Elevate your legs at night to above the level of your heart. If you get a cut in the skin over the varicose vein and the vein bleeds: Lie down with your leg raised. Apply firm pressure to the cut with a clean cloth until the bleeding stops. Place a bandage (dressing) on the cut. Contact a health care provider if: The skin around your varicose veins starts to break down. You have pain, redness, tenderness, or hard swelling over a vein. You are uncomfortable because of pain. You get a cut in the skin over a varicose vein and it will not stop bleeding. Summary Varicose veins are veins that have become enlarged, bulged, and  twisted. They most often appear in the legs. This condition is caused by damage to the valves in the vein. These valves help blood return to your heart. Treatment for this condition includes frequent movements, wearing compression stockings, losing weight, and exercising regularly. In some cases, procedures are done to close off or  remove the veins. Treatment for this condition may include wearing compression stockings, elevating the legs, losing weight, and engaging in regular activity. In some cases, procedures are done to close off or remove the veins. This information is not intended to replace advice given to you by your health care provider. Make sure you discuss any questions you have with your health care provider. Document Revised: 08/23/2019 Document Reviewed: 08/23/2019 [employer] Patient Education  [place].

## 2021-01-26 ENCOUNTER — Other Ambulatory Visit: Payer: Self-pay

## 2021-01-26 ENCOUNTER — Ambulatory Visit
Admission: RE | Admit: 2021-01-26 | Discharge: 2021-01-26 | Disposition: A | Discharge status: Home / Self Care | Payer: Medicare HMO | Source: Ambulatory Visit | Attending: Internal Medicine | Admitting: Internal Medicine

## 2021-01-26 DIAGNOSIS — Z1231 Encounter for screening mammogram for malignant neoplasm of breast: Secondary | ICD-10-CM

## 2021-10-13 DIAGNOSIS — L02519 Cutaneous abscess of unspecified hand: Secondary | ICD-10-CM | POA: Diagnosis not present

## 2021-10-13 DIAGNOSIS — L02512 Cutaneous abscess of left hand: Secondary | ICD-10-CM | POA: Diagnosis not present

## 2021-12-18 ENCOUNTER — Other Ambulatory Visit: Payer: Self-pay | Admitting: Internal Medicine

## 2021-12-18 DIAGNOSIS — Z1231 Encounter for screening mammogram for malignant neoplasm of breast: Secondary | ICD-10-CM

## 2022-01-06 ENCOUNTER — Ambulatory Visit: Payer: Medicare HMO | Admitting: Podiatry

## 2022-01-07 DIAGNOSIS — Z0100 Encounter for examination of eyes and vision without abnormal findings: Secondary | ICD-10-CM | POA: Diagnosis not present

## 2022-01-07 DIAGNOSIS — H524 Presbyopia: Secondary | ICD-10-CM | POA: Diagnosis not present

## 2022-01-12 DIAGNOSIS — Z01419 Encounter for gynecological examination (general) (routine) without abnormal findings: Secondary | ICD-10-CM | POA: Diagnosis not present

## 2022-01-12 DIAGNOSIS — Z6836 Body mass index (BMI) 36.0-36.9, adult: Secondary | ICD-10-CM | POA: Diagnosis not present

## 2022-01-12 DIAGNOSIS — N952 Postmenopausal atrophic vaginitis: Secondary | ICD-10-CM | POA: Diagnosis not present

## 2022-01-27 ENCOUNTER — Ambulatory Visit: Payer: Medicare HMO | Admitting: Podiatry

## 2022-01-27 ENCOUNTER — Ambulatory Visit
Admission: RE | Admit: 2022-01-27 | Discharge: 2022-01-27 | Disposition: A | Discharge status: Home / Self Care | Payer: Medicare HMO | Source: Ambulatory Visit | Attending: Internal Medicine | Admitting: Internal Medicine

## 2022-01-27 ENCOUNTER — Ambulatory Visit (INDEPENDENT_AMBULATORY_CARE_PROVIDER_SITE_OTHER): Payer: Medicare HMO

## 2022-01-27 DIAGNOSIS — M722 Plantar fascial fibromatosis: Secondary | ICD-10-CM

## 2022-01-27 DIAGNOSIS — Z1231 Encounter for screening mammogram for malignant neoplasm of breast: Secondary | ICD-10-CM | POA: Diagnosis not present

## 2022-01-27 MED ORDER — TRIAMCINOLONE ACETONIDE 40 MG/ML IJ SUSP
20.0000 mg | Freq: Once | INTRAMUSCULAR | Status: AC
  Administered 2022-01-27: 20 mg

## 2022-01-27 NOTE — Progress Notes (Signed)
She presents today states that she is starting to have heel pain again right.  Objective: Vital signs are stable she is oriented x3.  Pulses are palpable.  She has pain on palpation medial calcaneal tubercle of the right heel.  Radiographs taken today demonstrate an plantar distally oriented calcaneal heel spur soft tissue increase in density plantar fascial kidney insertion site.  Assessment: Plan fasciitis right.  Plan: I injected the right heel today 20 mg Kenalog 5 mg Marcaine reminded her about appropriate shoe gear ice therapy and boot therapy.

## 2022-03-01 DIAGNOSIS — R7309 Other abnormal glucose: Secondary | ICD-10-CM | POA: Diagnosis not present

## 2022-03-01 DIAGNOSIS — Z1211 Encounter for screening for malignant neoplasm of colon: Secondary | ICD-10-CM | POA: Diagnosis not present

## 2022-03-01 DIAGNOSIS — Z79899 Other long term (current) drug therapy: Secondary | ICD-10-CM | POA: Diagnosis not present

## 2022-03-01 DIAGNOSIS — I10 Essential (primary) hypertension: Secondary | ICD-10-CM | POA: Diagnosis not present

## 2022-03-01 DIAGNOSIS — Z853 Personal history of malignant neoplasm of breast: Secondary | ICD-10-CM | POA: Diagnosis not present

## 2022-03-01 DIAGNOSIS — Z0000 Encounter for general adult medical examination without abnormal findings: Secondary | ICD-10-CM | POA: Diagnosis not present

## 2022-03-01 DIAGNOSIS — E785 Hyperlipidemia, unspecified: Secondary | ICD-10-CM | POA: Diagnosis not present

## 2022-03-10 DIAGNOSIS — Z1211 Encounter for screening for malignant neoplasm of colon: Secondary | ICD-10-CM | POA: Diagnosis not present

## 2022-03-15 ENCOUNTER — Encounter: Payer: Self-pay | Admitting: Podiatry

## 2022-03-15 ENCOUNTER — Ambulatory Visit: Payer: Medicare HMO | Admitting: Podiatry

## 2022-03-15 DIAGNOSIS — M722 Plantar fascial fibromatosis: Secondary | ICD-10-CM

## 2022-03-15 MED ORDER — TRIAMCINOLONE ACETONIDE 40 MG/ML IJ SUSP
20.0000 mg | Freq: Once | INTRAMUSCULAR | Status: AC
  Administered 2022-03-15: 20 mg

## 2022-03-15 NOTE — Progress Notes (Signed)
She presents today states that her foot is really not any better at all as she refers to her right heel.  Objective: Vital signs are stable she is alert and oriented x3.  Pulses are palpable.  She has significant pain on palpation medial calcaneal tubercle of the right heel.  Assessment: Planter fasciitis intractable right.  Plan: Reinjected today.  Offered her oral anti-inflammatories she declined.  May need to consider MRI next visit if not improved considerably.  And we did discuss possible need for orthotics if MRI was negative.

## 2022-06-07 DIAGNOSIS — R7309 Other abnormal glucose: Secondary | ICD-10-CM | POA: Diagnosis not present

## 2022-09-28 DIAGNOSIS — B078 Other viral warts: Secondary | ICD-10-CM | POA: Diagnosis not present

## 2022-09-28 DIAGNOSIS — D485 Neoplasm of uncertain behavior of skin: Secondary | ICD-10-CM | POA: Diagnosis not present

## 2022-09-28 DIAGNOSIS — D2261 Melanocytic nevi of right upper limb, including shoulder: Secondary | ICD-10-CM | POA: Diagnosis not present

## 2022-09-28 DIAGNOSIS — L309 Dermatitis, unspecified: Secondary | ICD-10-CM | POA: Diagnosis not present

## 2022-09-30 ENCOUNTER — Ambulatory Visit
Admission: RE | Admit: 2022-09-30 | Discharge: 2022-09-30 | Disposition: A | Discharge status: Home / Self Care | Payer: Medicare HMO | Source: Ambulatory Visit | Attending: Family Medicine | Admitting: Family Medicine

## 2022-09-30 ENCOUNTER — Other Ambulatory Visit: Payer: Self-pay

## 2022-09-30 DIAGNOSIS — M25562 Pain in left knee: Secondary | ICD-10-CM

## 2022-09-30 DIAGNOSIS — M79605 Pain in left leg: Secondary | ICD-10-CM

## 2022-11-08 DIAGNOSIS — M25562 Pain in left knee: Secondary | ICD-10-CM | POA: Diagnosis not present

## 2022-12-03 DIAGNOSIS — M1712 Unilateral primary osteoarthritis, left knee: Secondary | ICD-10-CM | POA: Diagnosis not present

## 2022-12-03 DIAGNOSIS — M25562 Pain in left knee: Secondary | ICD-10-CM | POA: Diagnosis not present

## 2022-12-06 ENCOUNTER — Other Ambulatory Visit: Payer: Self-pay | Admitting: Surgery

## 2022-12-06 DIAGNOSIS — M25562 Pain in left knee: Secondary | ICD-10-CM

## 2022-12-06 DIAGNOSIS — M1712 Unilateral primary osteoarthritis, left knee: Secondary | ICD-10-CM

## 2022-12-09 ENCOUNTER — Ambulatory Visit
Admission: RE | Admit: 2022-12-09 | Discharge: 2022-12-09 | Disposition: A | Discharge status: Home / Self Care | Payer: Medicare HMO | Source: Ambulatory Visit | Attending: Surgery | Admitting: Surgery

## 2022-12-09 DIAGNOSIS — M25462 Effusion, left knee: Secondary | ICD-10-CM | POA: Diagnosis not present

## 2022-12-09 DIAGNOSIS — M1712 Unilateral primary osteoarthritis, left knee: Secondary | ICD-10-CM | POA: Diagnosis not present

## 2022-12-09 DIAGNOSIS — M25562 Pain in left knee: Secondary | ICD-10-CM | POA: Diagnosis not present

## 2022-12-09 DIAGNOSIS — S83272A Complex tear of lateral meniscus, current injury, left knee, initial encounter: Secondary | ICD-10-CM | POA: Diagnosis not present

## 2022-12-09 DIAGNOSIS — R609 Edema, unspecified: Secondary | ICD-10-CM | POA: Diagnosis not present

## 2022-12-29 ENCOUNTER — Other Ambulatory Visit: Payer: Self-pay | Admitting: Internal Medicine

## 2022-12-29 DIAGNOSIS — M1712 Unilateral primary osteoarthritis, left knee: Secondary | ICD-10-CM | POA: Diagnosis not present

## 2022-12-29 DIAGNOSIS — M8430XA Stress fracture, unspecified site, initial encounter for fracture: Secondary | ICD-10-CM | POA: Diagnosis not present

## 2022-12-29 DIAGNOSIS — S83282A Other tear of lateral meniscus, current injury, left knee, initial encounter: Secondary | ICD-10-CM | POA: Diagnosis not present

## 2022-12-29 DIAGNOSIS — Z1231 Encounter for screening mammogram for malignant neoplasm of breast: Secondary | ICD-10-CM

## 2023-01-17 DIAGNOSIS — N952 Postmenopausal atrophic vaginitis: Secondary | ICD-10-CM | POA: Diagnosis not present

## 2023-01-17 DIAGNOSIS — Z01419 Encounter for gynecological examination (general) (routine) without abnormal findings: Secondary | ICD-10-CM | POA: Diagnosis not present

## 2023-01-17 DIAGNOSIS — Z6834 Body mass index (BMI) 34.0-34.9, adult: Secondary | ICD-10-CM | POA: Diagnosis not present

## 2023-01-18 DIAGNOSIS — L7211 Pilar cyst: Secondary | ICD-10-CM | POA: Diagnosis not present

## 2023-01-18 DIAGNOSIS — R238 Other skin changes: Secondary | ICD-10-CM | POA: Diagnosis not present

## 2023-01-18 DIAGNOSIS — B078 Other viral warts: Secondary | ICD-10-CM | POA: Diagnosis not present

## 2023-02-01 ENCOUNTER — Ambulatory Visit
Admission: RE | Admit: 2023-02-01 | Discharge: 2023-02-01 | Disposition: A | Discharge status: Home / Self Care | Payer: Medicare HMO | Source: Ambulatory Visit | Attending: Internal Medicine | Admitting: Internal Medicine

## 2023-02-01 DIAGNOSIS — Z1231 Encounter for screening mammogram for malignant neoplasm of breast: Secondary | ICD-10-CM | POA: Diagnosis not present

## 2023-02-02 DIAGNOSIS — M1712 Unilateral primary osteoarthritis, left knee: Secondary | ICD-10-CM | POA: Diagnosis not present

## 2023-12-27 ENCOUNTER — Other Ambulatory Visit: Payer: Self-pay | Admitting: Internal Medicine

## 2023-12-27 DIAGNOSIS — Z1231 Encounter for screening mammogram for malignant neoplasm of breast: Secondary | ICD-10-CM

## 2024-02-02 ENCOUNTER — Ambulatory Visit
Admission: RE | Admit: 2024-02-02 | Discharge: 2024-02-02 | Disposition: A | Source: Ambulatory Visit | Attending: Internal Medicine | Admitting: Internal Medicine

## 2024-02-02 DIAGNOSIS — Z1231 Encounter for screening mammogram for malignant neoplasm of breast: Secondary | ICD-10-CM
